# Patient Record
Sex: Female | Born: 1990 | Hispanic: No | Marital: Single | Smoking: Never smoker
Health system: Southern US, Community
[De-identification: ages and names within clinical notes are randomized; demographics above are authoritative.]

## PROBLEM LIST (undated history)

## (undated) DIAGNOSIS — F419 Anxiety disorder, unspecified: Secondary | ICD-10-CM

## (undated) DIAGNOSIS — H269 Unspecified cataract: Secondary | ICD-10-CM

## (undated) DIAGNOSIS — T7840XA Allergy, unspecified, initial encounter: Secondary | ICD-10-CM

## (undated) DIAGNOSIS — F32A Depression, unspecified: Secondary | ICD-10-CM

## (undated) DIAGNOSIS — F329 Major depressive disorder, single episode, unspecified: Secondary | ICD-10-CM

## (undated) DIAGNOSIS — M3300 Juvenile dermatopolymyositis, organ involvement unspecified: Secondary | ICD-10-CM

## (undated) HISTORY — DX: Juvenile dermatomyositis, organ involvement unspecified: M33.00

## (undated) HISTORY — DX: Depression, unspecified: F32.A

## (undated) HISTORY — DX: Unspecified cataract: H26.9

## (undated) HISTORY — DX: Allergy, unspecified, initial encounter: T78.40XA

## (undated) HISTORY — DX: Major depressive disorder, single episode, unspecified: F32.9

## (undated) HISTORY — DX: Anxiety disorder, unspecified: F41.9

## (undated) HISTORY — PX: OTHER SURGICAL HISTORY: SHX169

## (undated) HISTORY — PX: APPENDECTOMY: SHX54

---

## 2012-10-14 ENCOUNTER — Ambulatory Visit (INDEPENDENT_AMBULATORY_CARE_PROVIDER_SITE_OTHER): Payer: BC Managed Care – PPO | Admitting: Internal Medicine

## 2012-10-14 VITALS — BP 110/70 | HR 85 | Temp 98.6°F | Resp 16 | Ht 68.5 in | Wt 131.2 lb

## 2012-10-14 DIAGNOSIS — M3300 Juvenile dermatopolymyositis, organ involvement unspecified: Secondary | ICD-10-CM | POA: Insufficient documentation

## 2012-10-14 DIAGNOSIS — J309 Allergic rhinitis, unspecified: Secondary | ICD-10-CM | POA: Insufficient documentation

## 2012-10-14 DIAGNOSIS — R112 Nausea with vomiting, unspecified: Secondary | ICD-10-CM

## 2012-10-14 DIAGNOSIS — IMO0001 Reserved for inherently not codable concepts without codable children: Secondary | ICD-10-CM | POA: Insufficient documentation

## 2012-10-14 DIAGNOSIS — F319 Bipolar disorder, unspecified: Secondary | ICD-10-CM

## 2012-10-14 LAB — CBC WITH DIFFERENTIAL/PLATELET
Basophils Absolute: 0 10*3/uL (ref 0.0–0.1)
Basophils Relative: 0 % (ref 0–1)
Eosinophils Absolute: 0.1 10*3/uL (ref 0.0–0.7)
Eosinophils Relative: 2 % (ref 0–5)
HCT: 32.4 % — ABNORMAL LOW (ref 36.0–46.0)
Hemoglobin: 11.1 g/dL — ABNORMAL LOW (ref 12.0–15.0)
Lymphocytes Relative: 23 % (ref 12–46)
Lymphs Abs: 1.1 10*3/uL (ref 0.7–4.0)
MCH: 28.9 pg (ref 26.0–34.0)
MCHC: 34.3 g/dL (ref 30.0–36.0)
MCV: 84.4 fL (ref 78.0–100.0)
Monocytes Absolute: 0.5 10*3/uL (ref 0.1–1.0)
Monocytes Relative: 10 % (ref 3–12)
Neutro Abs: 2.9 10*3/uL (ref 1.7–7.7)
Neutrophils Relative %: 65 % (ref 43–77)
Platelets: 373 10*3/uL (ref 150–400)
RBC: 3.84 MIL/uL — ABNORMAL LOW (ref 3.87–5.11)
RDW: 13 % (ref 11.5–15.5)
WBC: 4.5 10*3/uL (ref 4.0–10.5)

## 2012-10-14 LAB — COMPREHENSIVE METABOLIC PANEL
ALT: 18 U/L (ref 0–35)
AST: 24 U/L (ref 0–37)
Albumin: 4.4 g/dL (ref 3.5–5.2)
Alkaline Phosphatase: 41 U/L (ref 39–117)
BUN: 10 mg/dL (ref 6–23)
CO2: 27 mEq/L (ref 19–32)
Calcium: 9.3 mg/dL (ref 8.4–10.5)
Chloride: 104 mEq/L (ref 96–112)
Creat: 0.46 mg/dL — ABNORMAL LOW (ref 0.50–1.10)
Glucose, Bld: 87 mg/dL (ref 70–99)
Potassium: 4.4 mEq/L (ref 3.5–5.3)
Sodium: 137 mEq/L (ref 135–145)
Total Bilirubin: 0.4 mg/dL (ref 0.3–1.2)
Total Protein: 7.5 g/dL (ref 6.0–8.3)

## 2012-10-14 LAB — POCT URINALYSIS DIPSTICK
Bilirubin, UA: NEGATIVE
Blood, UA: NEGATIVE
Glucose, UA: NEGATIVE
Nitrite, UA: NEGATIVE
Urobilinogen, UA: 0.2

## 2012-10-14 LAB — POCT UA - MICROSCOPIC ONLY
Casts, Ur, LPF, POC: NEGATIVE
Crystals, Ur, HPF, POC: NEGATIVE
Yeast, UA: NEGATIVE

## 2012-10-14 MED ORDER — ONDANSETRON HCL 4 MG PO TABS: 4.0000 mg | ORAL_TABLET | Freq: Three times a day (TID) | ORAL | Status: DC | PRN

## 2012-10-14 NOTE — Progress Notes (Signed)
Subjective:    Patient ID: Martha Noble, female    DOB: 02-20-1990, 22 y.o.   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 [place] I believe Concerned about 10 days of nausea/has only vomited twice during that time on the first day and this morning No abdominal pain No constipation or diarrhea No history of ulcer problems or reflux/no current symptoms to suggest those Appetite has been decreased during this time with a 4 pound weight loss She has not had fever No dysuria or frequency No vaginal discharge Last menstrual period 3 weeks ago Sexually active with contraception/2 negative pregnancy tests yesterday Surgery 3 weeks ago to remove a calcified lesion in her back which was normal Vertigo on 2 days preceding the onset of nausea which resolved spontaneously  Past medical history -Juvenile dermatomyositis onset age 53- -bipolar disorder -Allergic rhinitis Appendicitis  At 11  Current outpatient prescriptions:busPIRone (BUSPAR) 10 MG tablet, Take 10 mg by mouth 2 (two) times daily., Disp: , Rfl: ;   cycloSPORINE (SANDIMMUNE) 100 MG capsule, Take 150 mg by mouth 2 (two) times daily., Disp: , Rfl: ;   doxylamine, Sleep, (UNISOM) 25 MG tablet, Take 25 mg by mouth at bedtime as needed for sleep., Disp: , Rfl: ;   escitalopram (LEXAPRO) 10 MG tablet, Take 10 mg by mouth daily., Disp: , Rfl:  hydroxychloroquine (PLAQUENIL) 200 MG tablet, Take 300 mg by mouth daily., Disp: , Rfl: ;   lamoTRIgine (LAMICTAL) 100 MG tablet, Take 100 mg by mouth 2 (two) times daily., Disp: , Rfl: ;   norethindrone-ethinyl estradiol (JUNEL FE,GILDESS FE,LOESTRIN FE) 1-20 MG-MCG tablet, Take 1 tablet by mouth daily., Disp: , Rfl: ;   predniSONE (DELTASONE) 1 MG tablet, Take 2 mg by mouth daily., Disp: , Rfl: (weaning off)    Review of Systems  Constitutional: Positive for appetite change, fatigue and unexpected weight change. Negative for fever, chills and activity change.   HENT: Positive for sneezing and postnasal drip. Negative for ear pain, mouth sores, trouble swallowing and neck pain.   Eyes: Negative for visual disturbance.  Respiratory: Negative for cough, chest tightness and shortness of breath.   Cardiovascular: Negative for chest pain, palpitations and leg swelling.  Gastrointestinal: Negative for abdominal pain, diarrhea, constipation, blood in stool, abdominal distention and anal bleeding.  Genitourinary: Negative for urgency, frequency, difficulty urinating, menstrual problem and pelvic pain.  Musculoskeletal: Negative for back pain and joint swelling.  Neurological: Negative for weakness and headaches.  Hematological: Does not bruise/bleed easily.       Objective:   Physical Exam BP 110/70  Pulse 85  Temp(Src) 98.6 F (37 C) (Oral)  Resp 16  Ht 5' 8.5" (1.74 m)  Wt 131 lb 3.2 oz (59.512 kg)  BMI 19.66 kg/m2  SpO2 100%  LMP 10/06/2012 No acute distress Well-developed and well-nourished Pupils equal round reactive to light and accommodation/EOMs conjugate TMs clear/nares slightly boggy Throat clear No a.c. Notes/1+ right p.c. note she's been present for years No thyromegaly Lungs clear Heart regular without murmur  Abdomen benign without organomegaly Skin shows some pigmentary changes and thickening but nothing acute Joints clear Neuro intact Mood good     Assessment & Plan:  Nausea with vomiting - Plan: CBC with Differential, Comprehensive metabolic panel, POCT urinalysis dipstick, POCT UA - Microscopic Only, ondansetron (ZOFRAN) 4 MG tablet  Unclear etiology-we discussed several possibilities and decided that the best approach was to control the nausea and followup for further symptoms before doing extensive testing She will use  Zofran and push fluids and followup if not well a week Results for orders placed in visit on 10/14/12  CBC WITH DIFFERENTIAL      Result Value Range   WBC 4.5  4.0 - 10.5 K/uL   RBC 3.84 (*) 3.87  - 5.11 MIL/uL   Hemoglobin 11.1 (*) 12.0 - 15.0 g/dL   HCT 81.1 (*) 91.4 - 78.2 %   MCV 84.4  78.0 - 100.0 fL   MCH 28.9  26.0 - 34.0 pg   MCHC 34.3  30.0 - 36.0 g/dL   RDW 95.6  21.3 - 08.6 %   Platelets 373  150 - 400 K/uL   Neutrophils Relative % 65  43 - 77 %   Neutro Abs 2.9  1.7 - 7.7 K/uL   Lymphocytes Relative 23  12 - 46 %   Lymphs Abs 1.1  0.7 - 4.0 K/uL   Monocytes Relative 10  3 - 12 %   Monocytes Absolute 0.5  0.1 - 1.0 K/uL   Eosinophils Relative 2  0 - 5 %   Eosinophils Absolute 0.1  0.0 - 0.7 K/uL   Basophils Relative 0  0 - 1 %   Basophils Absolute 0.0  0.0 - 0.1 K/uL   Smear Review Criteria for review not met    COMPREHENSIVE METABOLIC PANEL      Result Value Range   Sodium 137  135 - 145 mEq/L   Potassium 4.4  3.5 - 5.3 mEq/L   Chloride 104  96 - 112 mEq/L   CO2 27  19 - 32 mEq/L   Glucose, Bld 87  70 - 99 mg/dL   BUN 10  6 - 23 mg/dL   Creat 5.78 (*) 4.69 - 1.10 mg/dL   Total Bilirubin 0.4  0.3 - 1.2 mg/dL   Alkaline Phosphatase 41  39 - 117 U/L   AST 24  0 - 37 U/L   ALT 18  0 - 35 U/L   Total Protein 7.5  6.0 - 8.3 g/dL   Albumin 4.4  3.5 - 5.2 g/dL   Calcium 9.3  8.4 - 62.9 mg/dL  POCT URINALYSIS DIPSTICK      Result Value Range   Color, UA yellow     Clarity, UA clear     Glucose, UA neg     Bilirubin, UA neg     Ketones, UA neg     Spec Grav, UA >=1.030     Blood, UA neg     pH, UA 7.0     Protein, UA neg     Urobilinogen, UA 0.2     Nitrite, UA neg     Leukocytes, UA Trace    POCT UA - MICROSCOPIC ONLY      Result Value Range   WBC, Ur, HPF, POC 0-2     RBC, urine, microscopic 3-4     Bacteria, U Microscopic 1+     Mucus, UA pos     Epithelial cells, urine per micros 3-6     Crystals, Ur, HPF, POC neg     Casts, Ur, LPF, POC neg     Yeast, UA neg

## 2012-10-14 NOTE — Progress Notes (Signed)
  Subjective:    Patient ID: Martha Noble, female    DOB: 09-23-1990, 22 y.o.   MRN: [id number]  HPI    Review of Systems     Objective:   Physical Exam        Assessment & Plan:

## 2014-09-06 ENCOUNTER — Ambulatory Visit (INDEPENDENT_AMBULATORY_CARE_PROVIDER_SITE_OTHER): Payer: BLUE CROSS/BLUE SHIELD | Admitting: Physician Assistant

## 2014-09-06 ENCOUNTER — Ambulatory Visit (INDEPENDENT_AMBULATORY_CARE_PROVIDER_SITE_OTHER): Payer: BLUE CROSS/BLUE SHIELD

## 2014-09-06 VITALS — BP 110/72 | HR 101 | Temp 98.3°F | Resp 18 | Ht 68.5 in | Wt 132.0 lb

## 2014-09-06 DIAGNOSIS — D849 Immunodeficiency, unspecified: Secondary | ICD-10-CM

## 2014-09-06 DIAGNOSIS — R059 Cough, unspecified: Secondary | ICD-10-CM

## 2014-09-06 DIAGNOSIS — D899 Disorder involving the immune mechanism, unspecified: Secondary | ICD-10-CM

## 2014-09-06 DIAGNOSIS — R05 Cough: Secondary | ICD-10-CM

## 2014-09-06 MED ORDER — AZITHROMYCIN 250 MG PO TABS: ORAL_TABLET | ORAL | Status: DC

## 2014-09-06 MED ORDER — BENZONATATE 100 MG PO CAPS: 100.0000 mg | ORAL_CAPSULE | Freq: Three times a day (TID) | ORAL | Status: DC | PRN

## 2014-09-06 MED ORDER — HYDROCODONE-HOMATROPINE 5-1.5 MG/5ML PO SYRP: 5.0000 mL | ORAL_SOLUTION | Freq: Three times a day (TID) | ORAL | Status: DC | PRN

## 2014-09-06 NOTE — Progress Notes (Signed)
09/06/2014 at 3:39 PM  Martha Noble / DOB: Jun 20, 1990 / MRN: [id number]  The patient has Juvenile dermatomyositis; Bipolar disorder, unspecified; Allergic rhinitis; and Contraception on her problem list.  SUBJECTIVE  Chief complaint: Cough; Nasal Congestion; and Sore Throat  Martha Noble is a 24 y.o. female with a pertinent medical history of immunosuppression 2/2 medical therapy for dermatomyositis.  She complains of one week of cough, sore throat and nasal congestion.  The sore throat has resolved and the nasal congestion has improved.  She continues with dry cough, denies SOB, wheezing, nausea, diaphoresis, fever, and chest pain. Feels well aside from cough. She is a never smoker.   She  has a past medical history of Allergy; Depression; Cataract; Juvenile dermatomyositis; Anxiety; and Juvenile dermatomyositis.    Medications reviewed and updated by myself where necessary, and exist elsewhere in the encounter.   Ms. Yebra is allergic to amoxicillin. She  reports that she has never smoked. She does not have any smokeless tobacco history on file. She reports that she drinks about 0.6 oz of alcohol per week. She reports that she uses illicit drugs (Marijuana). She  has no sexual activity history on file. The patient  has past surgical history that includes Appendectomy; nodule removal rt flank; and removal of calcified .  Her family history includes Cancer in her maternal grandmother; Diabetes in her father and mother; Mental illness in her maternal grandfather and paternal grandmother.  Review of Systems  Eyes: Negative.   Skin: Negative for rash.  Neurological: Negative for dizziness, weakness and headaches.    OBJECTIVE  Her  height is 5' 8.5" (1.74 m) and weight is 132 lb (59.875 kg). Her oral temperature is 98.3 F (36.8 C). Her blood pressure is 110/72 and her pulse is 101. Her respiration is 18 and oxygen saturation is 96%.  The patient's body mass index is 19.78  kg/(m^2).  Physical Exam  Vitals reviewed. Constitutional: She is oriented to person, place, and time. She appears well-developed and well-nourished. No distress.  HENT:  Right Ear: Hearing, tympanic membrane, external ear and ear canal normal.  Left Ear: Hearing, tympanic membrane, external ear and ear canal normal.  Nose: Mucosal edema present. Right sinus exhibits no maxillary sinus tenderness and no frontal sinus tenderness. Left sinus exhibits no maxillary sinus tenderness and no frontal sinus tenderness.  Mouth/Throat: Uvula is midline, oropharynx is clear and moist and mucous membranes are normal.  Cardiovascular: Normal rate, regular rhythm and normal heart sounds.   Respiratory: Effort normal and breath sounds normal. She has no wheezes. She has no rales.  Neurological: She is alert and oriented to person, place, and time.  Skin: Skin is warm and dry. She is not diaphoretic.  Psychiatric: She has a normal mood and affect.    No results found for this or any previous visit (from the past 24 hour(s)).  [facility] reading (PRIMARY) by  Dr. Clelia Croft: Normal chest, osteopenic vertebre.   ASSESSMENT & PLAN  Idaly was seen today for cough, nasal congestion and sore throat.  Diagnoses and all orders for this visit:  Cough: Patient with reassuring exam and radiograph.  Given problem two will cover for bacterial etiology, however her symptoms are most consistent with a viral etiology.  She denies pain today. n Orders: -     DG Chest 2 View; Future -     azithromycin (ZITHROMAX) 250 MG tablet; Take 2 tabs PO x 1 dose, then 1 tab PO QD x 4  days -     HYDROcodone-homatropine (HYCODAN) 5-1.5 MG/5ML syrup; Take 5 mLs by mouth every 8 (eight) hours as needed for cough. -     benzonatate (TESSALON) 100 MG capsule; Take 1-2 capsules (100-200 mg total) by mouth 3 (three) times daily as needed for cough.  Immunosuppression  The patient was advised to call or come back to clinic if she does not see an  improvement in symptoms, or worsens with the above plan.   Deliah Boston, MHS, PA-C [facility] 09/06/2014 3:39 PM   Addendum 5:30 PM Patient returned with a story that her primary care physician had wanted her to be evaluated by an M.D. Not PA and she was requesting that History is certainly consistent with bronchospasm secondary to a lower respiratory infection and my exam reveals nothing in HEENT or lung/heart that would disagree with the diagnosis-- this could either be viral or mycoplasma and so the treatment with Zithromax was prudent. Other medicines are for her comfort. If she worsens with the wheezing she can return to consider albuterol or prednisone but right now she has minimal wheezing with forced expiration and is not very symptomatic--I concur that the x-ray showed no acute infection I have participated in the care of this patient with the Advanced Practice Provider and agree with Diagnosis and Plan as documented. Robert P. Merla Riches, M.D.

## 2014-09-09 ENCOUNTER — Ambulatory Visit (INDEPENDENT_AMBULATORY_CARE_PROVIDER_SITE_OTHER): Payer: BLUE CROSS/BLUE SHIELD

## 2014-09-09 ENCOUNTER — Ambulatory Visit (INDEPENDENT_AMBULATORY_CARE_PROVIDER_SITE_OTHER): Payer: BLUE CROSS/BLUE SHIELD | Admitting: Emergency Medicine

## 2014-09-09 VITALS — BP 130/80 | HR 96 | Temp 98.5°F | Resp 18 | Ht 68.0 in | Wt 131.4 lb

## 2014-09-09 DIAGNOSIS — R109 Unspecified abdominal pain: Secondary | ICD-10-CM | POA: Diagnosis not present

## 2014-09-09 DIAGNOSIS — K5900 Constipation, unspecified: Secondary | ICD-10-CM

## 2014-09-09 NOTE — Patient Instructions (Signed)

## 2014-09-09 NOTE — Progress Notes (Signed)
Subjective:  Patient ID: Martha Noble, female    DOB: 1990-12-25  Age: 23 y.o. MRN: [id number]  CC: Constipation; Abdominal Pain; and Nausea   HPI Martha Noble presents   With abdominal pain  And constipation. She was seen over the weekend with bronchitis. She was treated with Zithromax and had a chest x-ray which was negative. Since that time she's had  Constipation and bloating. She has used a number of different over-the-counter remedies including prunes and Senokot. She's had some loose stool as as a consequence. She's had no blood in her stools or black stools. Said her symptoms of is improved but she still having some concerns about her abdomen she has no dysuria urgency or frequency  History Martha Noble has a past medical history of Allergy; Depression; Cataract; Juvenile dermatomyositis; Anxiety; and Juvenile dermatomyositis.   She has past surgical history that includes Appendectomy; nodule removal rt flank; and removal of calcified .   Her  family history includes Cancer in her maternal grandmother; Diabetes in her father and mother; Mental illness in her maternal grandfather and paternal grandmother.  She   reports that she has never smoked. She does not have any smokeless tobacco history on file. She reports that she drinks about 0.6 oz of alcohol per week. She reports that she uses illicit drugs (Marijuana).  Outpatient Prescriptions Prior to Visit  Medication Sig Dispense Refill  . azithromycin (ZITHROMAX) 250 MG tablet Take 2 tabs PO x 1 dose, then 1 tab PO QD x 4 days 6 tablet 0  . busPIRone (BUSPAR) 10 MG tablet Take 10 mg by mouth 2 (two) times daily.    . cycloSPORINE (SANDIMMUNE) 100 MG capsule Take 150 mg by mouth 2 (two) times daily.    Marland Kitchen escitalopram (LEXAPRO) 10 MG tablet Take 10 mg by mouth daily.    . hydroxychloroquine (PLAQUENIL) 200 MG tablet Take 300 mg by mouth daily.    Marland Kitchen lamoTRIgine (LAMICTAL) 100 MG tablet Take 100 mg by mouth 2 (two) times daily.     Marland Kitchen levonorgestrel (MIRENA) 20 MCG/24HR IUD 1 each by Intrauterine route once.    . mycophenolate (CELLCEPT) 250 MG capsule Take 1,000 mg by mouth 2 (two) times daily.    . predniSONE (DELTASONE) 1 MG tablet Take 2 mg by mouth daily.    . benzonatate (TESSALON) 100 MG capsule Take 1-2 capsules (100-200 mg total) by mouth 3 (three) times daily as needed for cough. (Patient not taking: Reported on 09/09/2014) 40 capsule 0  . doxylamine, Sleep, (UNISOM) 25 MG tablet Take 25 mg by mouth at bedtime as needed for sleep.    Marland Kitchen HYDROcodone-homatropine (HYCODAN) 5-1.5 MG/5ML syrup Take 5 mLs by mouth every 8 (eight) hours as needed for cough. (Patient not taking: Reported on 09/09/2014) 120 mL 0  . norethindrone-ethinyl estradiol (JUNEL FE,GILDESS FE,LOESTRIN FE) 1-20 MG-MCG tablet Take 1 tablet by mouth daily.    . ondansetron (ZOFRAN) 4 MG tablet Take 1 tablet (4 mg total) by mouth every 8 (eight) hours as needed for nausea. (Patient not taking: Reported on 09/06/2014) 20 tablet 0   No facility-administered medications prior to visit.    History   Social History  . Marital Status: Single    Spouse Name: N/A  . Number of Children: N/A  . Years of Education: N/A   Social History Main Topics  . Smoking status: Never Smoker   . Smokeless tobacco: Not on file  . Alcohol Use: 0.6 oz/week  1 Standard drinks or equivalent per week  . Drug Use: Yes    Special: Marijuana  . Sexual Activity: Not on file   Other Topics Concern  . None   Social History Narrative     Review of Systems  Constitutional: Negative for fever, chills and appetite change.  HENT: Negative for congestion, ear pain, postnasal drip, sinus pressure and sore throat.   Eyes: Negative for pain and redness.  Respiratory: Negative for cough, shortness of breath and wheezing.   Cardiovascular: Negative for leg swelling.  Gastrointestinal: Positive for constipation and abdominal distention. Negative for nausea, vomiting, abdominal  pain, diarrhea and blood in stool.  Endocrine: Negative for polyuria.  Genitourinary: Negative for dysuria, urgency, frequency and flank pain.  Musculoskeletal: Negative for gait problem.  Skin: Negative for rash.  Neurological: Negative for weakness and headaches.  Psychiatric/Behavioral: Negative for confusion and decreased concentration. The patient is not nervous/anxious.     Objective:  BP 130/80 mmHg  Pulse 96  Temp(Src) 98.5 F (36.9 C) (Oral)  Resp 18  Ht 5\' 8"  (1.727 m)  Wt 131 lb 6.4 oz (59.603 kg)  BMI 19.98 kg/m2  SpO2 97%  Physical Exam  Constitutional: She is oriented to person, place, and time. She appears well-developed and well-nourished. No distress.  HENT:  Head: Normocephalic and atraumatic.  Right Ear: External ear normal.  Left Ear: External ear normal.  Nose: Nose normal.  Eyes: Conjunctivae and EOM are normal. Pupils are equal, round, and reactive to light. No scleral icterus.  Neck: Normal range of motion. Neck supple. No tracheal deviation present.  Cardiovascular: Normal rate, regular rhythm and normal heart sounds.   Pulmonary/Chest: Effort normal. No respiratory distress. She has no wheezes. She has no rales.  Abdominal: Soft. Normal appearance and bowel sounds are normal. She exhibits no distension and no mass. There is no hepatosplenomegaly. There is no tenderness. There is no rebound and no guarding.  Musculoskeletal: She exhibits no edema.  Lymphadenopathy:    She has no cervical adenopathy.  Neurological: She is alert and oriented to person, place, and time. Coordination normal.  Skin: Skin is warm and dry. No rash noted.  Psychiatric: She has a normal mood and affect. Her behavior is normal. Thought content normal.      Assessment & Plan:   Martha Noble was seen today for constipation, abdominal pain and nausea.  Diagnoses and all orders for this visit:  Abdominal pain, unspecified abdominal location Orders: -     DG Abd 1 View;  Future  Constipation, unspecified constipation type   I am having Ms. Kirkpatrick maintain her escitalopram, lamoTRIgine, busPIRone, predniSONE, cycloSPORINE, hydroxychloroquine, norethindrone-ethinyl estradiol, doxylamine (Sleep), ondansetron, levonorgestrel, mycophenolate, azithromycin, HYDROcodone-homatropine, and benzonatate.  No orders of the defined types were placed in this encounter.    I reviewed her x-ray with her and it was unremarkable with exception was a collection of stool in her right colon. I suggested that if she wanted something she should try a dose of mag citrate and that will clean her out and allow her to resume her normal normal bowel habit.  Appropriate red flag conditions were discussed with the patient as well as actions that should be taken.  Patient expressed his understanding.  Follow-up: No Follow-up on file.  Carmelina Dane, MD   [facility] reading (PRIMARY) by  Dr. Dareen Piano.  No obst or free air.

## 2014-10-11 ENCOUNTER — Ambulatory Visit (INDEPENDENT_AMBULATORY_CARE_PROVIDER_SITE_OTHER): Payer: BLUE CROSS/BLUE SHIELD | Admitting: Family Medicine

## 2014-10-11 VITALS — BP 124/90 | HR 88 | Temp 98.9°F | Resp 16 | Ht 68.0 in | Wt 131.0 lb

## 2014-10-11 DIAGNOSIS — R1030 Lower abdominal pain, unspecified: Secondary | ICD-10-CM

## 2014-10-11 DIAGNOSIS — R350 Frequency of micturition: Secondary | ICD-10-CM | POA: Diagnosis not present

## 2014-10-11 DIAGNOSIS — R102 Pelvic and perineal pain: Secondary | ICD-10-CM | POA: Diagnosis not present

## 2014-10-11 LAB — POCT CBC
Granulocyte percent: 79.3 %G (ref 37–80)
HCT, POC: 35.9 % — AB (ref 37.7–47.9)
Hemoglobin: 11.4 g/dL — AB (ref 12.2–16.2)
Lymph, poc: 1.2 (ref 0.6–3.4)
MCH, POC: 25 pg — AB (ref 27–31.2)
MCHC: 31.6 g/dL — AB (ref 31.8–35.4)
MCV: 79.2 fL — AB (ref 80–97)
MID (cbc): 0.5 (ref 0–0.9)
MPV: 7.4 fL (ref 0–99.8)
POC Granulocyte: 6.5 (ref 2–6.9)
POC LYMPH PERCENT: 14.4 %L (ref 10–50)
POC MID %: 6.3 %M (ref 0–12)
Platelet Count, POC: 353 10*3/uL (ref 142–424)
RBC: 4.54 M/uL (ref 4.04–5.48)
RDW, POC: 15.2 %
WBC: 8.2 10*3/uL (ref 4.6–10.2)

## 2014-10-11 LAB — POCT URINALYSIS DIPSTICK
Bilirubin, UA: NEGATIVE
Blood, UA: NEGATIVE
Glucose, UA: NEGATIVE
Ketones, UA: NEGATIVE
Leukocytes, UA: NEGATIVE
Nitrite, UA: NEGATIVE
Protein, UA: NEGATIVE
Spec Grav, UA: <=1.005
Urobilinogen, UA: 0.2
pH, UA: 6

## 2014-10-11 LAB — POCT UA - MICROSCOPIC ONLY
Casts, Ur, LPF, POC: NEGATIVE
Crystals, Ur, HPF, POC: NEGATIVE
Epithelial cells, urine per micros: NEGATIVE
Mucus, UA: NEGATIVE
RBC, urine, microscopic: NEGATIVE
Yeast, UA: NEGATIVE

## 2014-10-11 MED ORDER — METRONIDAZOLE 500 MG PO TABS: 500.0000 mg | ORAL_TABLET | Freq: Two times a day (BID) | ORAL | Status: AC

## 2014-10-11 MED ORDER — DOXYCYCLINE HYCLATE 100 MG PO TABS: 100.0000 mg | ORAL_TABLET | Freq: Two times a day (BID) | ORAL | Status: AC

## 2014-10-11 NOTE — Patient Instructions (Signed)
Endometritis Endometritis is an irritation, soreness, and swelling (inflammation) of the lining of the uterus (endometrium).  CAUSES   Bacterial infections.  Sexually transmitted infections (STIs).  Having a miscarriage or childbirth, especially after a long labor or cesarean delivery.  Certain gynecological procedures (such as dilation and curettage, hysteroscopy, or contraceptive insertion). SIGNS AND SYMPTOMS   Fever.  Lower abdominal or pelvic pain.  Abnormal vaginal discharge or bleeding.  Abdominal bloating (distention) or swelling.  General discomfort or ill feeling.  Discomfort with bowel movements. DIAGNOSIS  A physical and pelvic exam are performed. Other tests may include:  Cultures from the cervix.  Blood tests.  Examining a tissue sample of the uterine lining (endometrial biopsy).  Examining discharge under a microscope (wet prep).  Laparoscopy. TREATMENT  Antibiotic medicines are usually given. Other treatments may include:  Fluids through an IV tube inserted in your vein.  Rest. HOME CARE INSTRUCTIONS   Take over-the-counter or prescription medicines for pain, discomfort, or fever as directed by your health care provider.  Take your antibiotics as directed. Finish them even if you start to feel better.  Resume your normal diet and activities as directed or as tolerated.  Do not douche or have sexual intercourse until your health care provider says it is okay.  Do not have sexual intercourse until your partner has been treated if your endometritis is caused by an STI. SEEK IMMEDIATE MEDICAL CARE IF:   You have swelling or increasing pain in the abdomen.  You have a fever.  You have bad smelling vaginal discharge, or you have an increased amount of discharge.  You have abnormal vaginal bleeding.  Your medicine is not helping with the pain.  You experience any problems that may be related to the medicine you are taking.  You have nausea  and vomiting, or you cannot keep foods down.  You have pain with bowel movements. MAKE SURE YOU:   Understand these instructions.  Will watch your condition.  Will get help right away if you are not doing well or get worse. Document Released: 01/23/2001 Document Revised: 10/01/2012 Document Reviewed: 08/28/2012 Exit[employer] Patient Information 2015 [employer]. This information is not intended to replace advice given to you by your health care provider. Make sure you discuss any questions you have with your health care provider.  

## 2014-10-11 NOTE — Progress Notes (Signed)
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  Also patient has developed some "bumps" on her labia.  Gynecologist tested for BV, HPV ruled out.  Non painful.  Had normal PAP at beginning of August.  PMHx:  S/P appendectomy, h/o dermatomyositis.  IUD in place  Sig Negs:  No fever  Objective:  NAD BP 124/90 mmHg  Pulse 88  Temp(Src) 98.9 F (37.2 C) (Oral)  Resp 16  Ht '5\' 8"'  (1.727 m)  Wt 131 lb (59.421 kg)  BMI 19.92 kg/m2  SpO2 98% Abdomen:  Soft, no guarding, no HSM.  Tender suprapubic area with deep palpation and some rebound.   Results for orders placed or performed in visit on 10/14/12  CBC with Differential  Result Value Ref Range   WBC 4.5 4.0 - 10.5 K/uL   RBC 3.84 (L) 3.87 - 5.11 MIL/uL   Hemoglobin 11.1 (L) 12.0 - 15.0 g/dL   HCT 32.4 (L) 36.0 - 46.0 %   MCV 84.4 78.0 - 100.0 fL   MCH 28.9 26.0 - 34.0 pg   MCHC 34.3 30.0 - 36.0 g/dL   RDW 13.0 11.5 - 15.5 %   Platelets 373 150 - 400 K/uL   Neutrophils Relative % 65 43 - 77 %   Neutro Abs 2.9 1.7 - 7.7 K/uL   Lymphocytes Relative 23 12 - 46 %   Lymphs Abs 1.1 0.7 - 4.0 K/uL   Monocytes Relative 10 3 - 12 %   Monocytes Absolute 0.5 0.1 - 1.0 K/uL   Eosinophils Relative 2 0 - 5 %   Eosinophils Absolute 0.1 0.0 - 0.7 K/uL   Basophils Relative 0 0 - 1 %   Basophils Absolute 0.0 0.0 - 0.1 K/uL   Smear Review Criteria for review not met   Comprehensive metabolic panel  Result Value Ref Range   Sodium 137 135 - 145 mEq/L   Potassium 4.4 3.5 - 5.3 mEq/L   Chloride 104 96 - 112 mEq/L   CO2 27 19 - 32 mEq/L   Glucose, Bld 87 70 - 99 mg/dL   BUN 10 6 - 23 mg/dL   Creat 0.46 (L) 0.50 - 1.10 mg/dL   Total Bilirubin 0.4 0.3 - 1.2 mg/dL   Alkaline Phosphatase 41 39 - 117 U/L   AST 24 0 - 37 U/L   ALT 18 0 - 35 U/L   Total Protein 7.5 6.0 - 8.3 g/dL   Albumin 4.4 3.5 - 5.2 g/dL   Calcium 9.3 8.4 - 10.5 mg/dL  POCT  urinalysis dipstick  Result Value Ref Range   Color, UA yellow    Clarity, UA clear    Glucose, UA neg    Bilirubin, UA neg    Ketones, UA neg    Spec Grav, UA >=1.030    Blood, UA neg    pH, UA 7.0    Protein, UA neg    Urobilinogen, UA 0.2    Nitrite, UA neg    Leukocytes, UA Trace   POCT UA - Microscopic Only  Result Value Ref Range   WBC, Ur, HPF, POC 0-2    RBC, urine, microscopic 3-4    Bacteria, U Microscopic 1+    Mucus, UA pos    Epithelial cells, urine per micros 3-6    Crystals, Ur, HPF, POC neg    Casts, Ur, LPF, POC neg    Yeast, UA neg  Results for orders placed or performed in visit on 10/11/14  POCT UA - Microscopic Only  Result Value Ref Range   WBC, Ur, HPF, POC 0-1    RBC, urine, microscopic neg    Bacteria, U Microscopic trace    Mucus, UA neg    Epithelial cells, urine per micros neg    Crystals, Ur, HPF, POC neg    Casts, Ur, LPF, POC neg    Yeast, UA neg   POCT urinalysis dipstick  Result Value Ref Range   Color, UA yellow    Clarity, UA clear    Glucose, UA neg    Bilirubin, UA neg    Ketones, UA neg    Spec Grav, UA <=1.005    Blood, UA neg    pH, UA 6.0    Protein, UA neg    Urobilinogen, UA 0.2    Nitrite, UA neg    Leukocytes, UA Negative Negative  POCT CBC  Result Value Ref Range   WBC 8.2 4.6 - 10.2 K/uL   Lymph, poc 1.2 0.6 - 3.4   POC LYMPH PERCENT 14.4 10 - 50 %L   MID (cbc) 0.5 0 - 0.9   POC MID % 6.3 0 - 12 %M   POC Granulocyte 6.5 2 - 6.9   Granulocyte percent 79.3 37 - 80 %G   RBC 4.54 4.04 - 5.48 M/uL   Hemoglobin 11.4 (A) 12.2 - 16.2 g/dL   HCT, POC 35.9 (A) 37.7 - 47.9 %   MCV 79.2 (A) 80 - 97 fL   MCH, POC 25.0 (A) 27 - 31.2 pg   MCHC 31.6 (A) 31.8 - 35.4 g/dL   RDW, POC 15.2 %   Platelet Count, POC 353 142 - 424 K/uL   MPV 7.4 0 - 99.8 fL     Pelvic exam: External genitalia show some mild irregular labial contour without HBP appearing lesions. Vagina shows mild white discharge Patient has cervical  motion tenderness Patient has mildly enlarged uterus which is tender, no adnexal swelling or tenderness  Assessment: Endometritis, mild  This chart was scribed in my presence and reviewed by me personally.    ICD-9-CM ICD-10-CM   1. Urinary frequency 788.41 R35.0 POCT UA - Microscopic Only     POCT urinalysis dipstick     Urine culture     GC/Chlamydia Probe Amp     HIV antibody (with reflex)     doxycycline (VIBRA-TABS) 100 MG tablet     metroNIDAZOLE (FLAGYL) 500 MG tablet  2. Lower abdominal pain 789.09 R10.30 POCT UA - Microscopic Only     POCT urinalysis dipstick     Urine culture     POCT CBC     GC/Chlamydia Probe Amp     HIV antibody (with reflex)     doxycycline (VIBRA-TABS) 100 MG tablet     metroNIDAZOLE (FLAGYL) 500 MG tablet  3. Female pelvic pain 625.9 R10.2 GC/Chlamydia Probe Amp     HIV antibody (with reflex)     doxycycline (VIBRA-TABS) 100 MG tablet     metroNIDAZOLE (FLAGYL) 500 MG tablet     Signed, Robyn Haber, MD

## 2014-10-12 LAB — URINE CULTURE: Colony Count: 6000

## 2014-10-12 LAB — HIV ANTIBODY (ROUTINE TESTING W REFLEX): HIV 1&2 Ab, 4th Generation: NONREACTIVE

## 2014-10-14 ENCOUNTER — Telehealth: Payer: Self-pay

## 2014-10-14 NOTE — Telephone Encounter (Signed)
Yes, I would like this done       Previous Messages     ----- Message -----   From: Johnnette Litter, CMA   Sent: 10/12/2014  2:45 PM    To: Elvina Sidle, MD   Wrong swab was used for [facility] collection. Would you like pt to RTC for a Uriprobe? Recollect at no charge      Left message on machine to call back

## 2014-10-15 LAB — GC/CHLAMYDIA PROBE AMP

## 2014-10-15 NOTE — Telephone Encounter (Signed)
Spoke with pt. Doesn't feel like she's been exposed to any STD's and declines coming in for a retest at this time

## 2014-10-19 ENCOUNTER — Telehealth: Payer: Self-pay

## 2014-10-19 NOTE — Telephone Encounter (Signed)
Patient is returning a missed phone call from lab. Please call! °

## 2014-10-20 NOTE — Telephone Encounter (Signed)
Will call pt. Notified of all labs already.

## 2014-11-03 ENCOUNTER — Other Ambulatory Visit (HOSPITAL_COMMUNITY): Payer: Self-pay | Admitting: *Deleted

## 2014-11-04 ENCOUNTER — Ambulatory Visit (HOSPITAL_COMMUNITY)
Admission: RE | Admit: 2014-11-04 | Discharge: 2014-11-04 | Disposition: A | Discharge status: Home / Self Care | Payer: BLUE CROSS/BLUE SHIELD | Source: Ambulatory Visit | Attending: Pediatrics | Admitting: Pediatrics

## 2014-11-04 DIAGNOSIS — M3300 Juvenile dermatopolymyositis, organ involvement unspecified: Secondary | ICD-10-CM | POA: Insufficient documentation

## 2014-11-04 MED ORDER — ONDANSETRON HCL 4 MG/2ML IJ SOLN
INTRAMUSCULAR | Status: AC
  Administered 2014-11-04: 4 mg
  Filled 2014-11-04: qty 2

## 2014-11-04 MED ORDER — ACETAMINOPHEN 325 MG PO TABS
ORAL_TABLET | ORAL | Status: AC
Start: 2014-11-04 — End: 2014-11-04
  Filled 2014-11-04: qty 2

## 2014-11-04 MED ORDER — DIPHENHYDRAMINE HCL 25 MG PO TABS
50.0000 mg | ORAL_TABLET | ORAL | Status: DC
  Administered 2014-11-04: 50 mg via ORAL
  Filled 2014-11-04: qty 2

## 2014-11-04 MED ORDER — SODIUM CHLORIDE 0.9 % IV SOLN
250.0000 mg | INTRAVENOUS | Status: DC
  Administered 2014-11-04: 250 mg via INTRAVENOUS
  Filled 2014-11-04: qty 2

## 2014-11-04 MED ORDER — SODIUM CHLORIDE 0.9 % IV SOLN
INTRAVENOUS | Status: DC
  Administered 2014-11-04: 10:00:00 via INTRAVENOUS

## 2014-11-04 MED ORDER — ACETAMINOPHEN 325 MG PO TABS
650.0000 mg | ORAL_TABLET | ORAL | Status: DC
  Administered 2014-11-04: 650 mg via ORAL

## 2014-11-04 MED ORDER — SODIUM CHLORIDE 0.9 % IV SOLN
1000.0000 mg | INTRAVENOUS | Status: DC
  Administered 2014-11-04: 1000 mg via INTRAVENOUS
  Filled 2014-11-04: qty 100

## 2014-11-04 MED ORDER — DIPHENHYDRAMINE HCL 25 MG PO CAPS
ORAL_CAPSULE | ORAL | Status: AC
  Filled 2014-11-04: qty 2

## 2014-11-04 MED ORDER — FAMOTIDINE 20 MG PO TABS
20.0000 mg | ORAL_TABLET | ORAL | Status: DC
  Administered 2014-11-04: 20 mg via ORAL
  Filled 2014-11-04: qty 1

## 2014-11-04 MED ORDER — ONDANSETRON HCL 4 MG/2ML IJ SOLN: 4.0000 mg | Freq: Once | INTRAMUSCULAR | Status: DC

## 2014-11-04 NOTE — Discharge Instructions (Signed)
Excuse from Work, [employer], or Physical Activity _____________________________[name]_____________________ needs to be excused from: _____ Work _x____ [employer] _____ Physical activity Beginning now and through the following date: _09/22/2016___________________ _____ she may return to work or school but still avoid physical activity from now until: ____________________ _x____ she may return to full physical activity as of: ______09/23/2016______________ Caregiver's signature: __Renee Peggye Pitt RN______________________________________  Date: ________________09/22/2016  [facility]______________________________________ Document Released: 07/25/2000 Document Revised: 04/23/2011 Document Reviewed: 01/29/2005 [employer] Patient Information 2015 [place], [place]. This information is not intended to replace advice given to you by your health care provider. Make sure you discuss any questions you have with your health care provider.

## 2014-11-18 ENCOUNTER — Encounter (HOSPITAL_COMMUNITY)
Admission: RE | Admit: 2014-11-18 | Discharge: 2014-11-18 | Disposition: A | Discharge status: Home / Self Care | Payer: BLUE CROSS/BLUE SHIELD | Source: Ambulatory Visit | Attending: Pediatrics | Admitting: Pediatrics

## 2014-11-18 DIAGNOSIS — M3300 Juvenile dermatopolymyositis, organ involvement unspecified: Secondary | ICD-10-CM | POA: Diagnosis present

## 2014-11-18 MED ORDER — FAMOTIDINE 20 MG PO TABS
20.0000 mg | ORAL_TABLET | ORAL | Status: AC
  Administered 2014-11-18: 20 mg via ORAL

## 2014-11-18 MED ORDER — DIPHENHYDRAMINE HCL 25 MG PO CAPS
ORAL_CAPSULE | ORAL | Status: AC
  Filled 2014-11-18: qty 2

## 2014-11-18 MED ORDER — ONDANSETRON 4 MG PO TBDP
4.0000 mg | ORAL_TABLET | Freq: Once | ORAL | Status: DC
  Filled 2014-11-18: qty 1

## 2014-11-18 MED ORDER — ACETAMINOPHEN 325 MG PO TABS
ORAL_TABLET | ORAL | Status: AC
  Filled 2014-11-18: qty 2

## 2014-11-18 MED ORDER — SODIUM CHLORIDE 0.9 % IV SOLN
250.0000 mg | INTRAVENOUS | Status: AC
  Administered 2014-11-18: 250 mg via INTRAVENOUS
  Filled 2014-11-18: qty 2

## 2014-11-18 MED ORDER — ONDANSETRON HCL 4 MG PO TABS: 4.0000 mg | ORAL_TABLET | Freq: Once | ORAL | Status: DC

## 2014-11-18 MED ORDER — ACETAMINOPHEN 325 MG PO TABS
650.0000 mg | ORAL_TABLET | ORAL | Status: AC
  Administered 2014-11-18: 650 mg via ORAL

## 2014-11-18 MED ORDER — DIPHENHYDRAMINE HCL 25 MG PO CAPS
50.0000 mg | ORAL_CAPSULE | ORAL | Status: AC
  Administered 2014-11-18: 50 mg via ORAL

## 2014-11-18 MED ORDER — SODIUM CHLORIDE 0.9 % IV SOLN
1000.0000 mg | INTRAVENOUS | Status: AC
  Administered 2014-11-18: 1000 mg via INTRAVENOUS
  Filled 2014-11-18: qty 100

## 2014-11-18 MED ORDER — SODIUM CHLORIDE 0.9 % IV SOLN
INTRAVENOUS | Status: AC
  Administered 2014-11-18: 10:00:00 via INTRAVENOUS

## 2014-12-24 ENCOUNTER — Other Ambulatory Visit: Payer: Self-pay | Admitting: Family Medicine

## 2014-12-24 DIAGNOSIS — R52 Pain, unspecified: Secondary | ICD-10-CM

## 2014-12-24 DIAGNOSIS — N83209 Unspecified ovarian cyst, unspecified side: Secondary | ICD-10-CM

## 2014-12-30 ENCOUNTER — Ambulatory Visit
Admission: RE | Admit: 2014-12-30 | Discharge: 2014-12-30 | Disposition: A | Discharge status: Home / Self Care | Payer: Self-pay | Source: Ambulatory Visit | Attending: Family Medicine | Admitting: Family Medicine

## 2014-12-30 DIAGNOSIS — R52 Pain, unspecified: Secondary | ICD-10-CM

## 2014-12-30 DIAGNOSIS — N83209 Unspecified ovarian cyst, unspecified side: Secondary | ICD-10-CM

## 2016-03-01 IMAGING — CR DG ABDOMEN 1V
1 series · 1 of 1 positions shown · non-contrast
Comparison: None.

CLINICAL DATA: Abdominal pain

EXAM:
ABDOMEN - 1 VIEW

[AP]
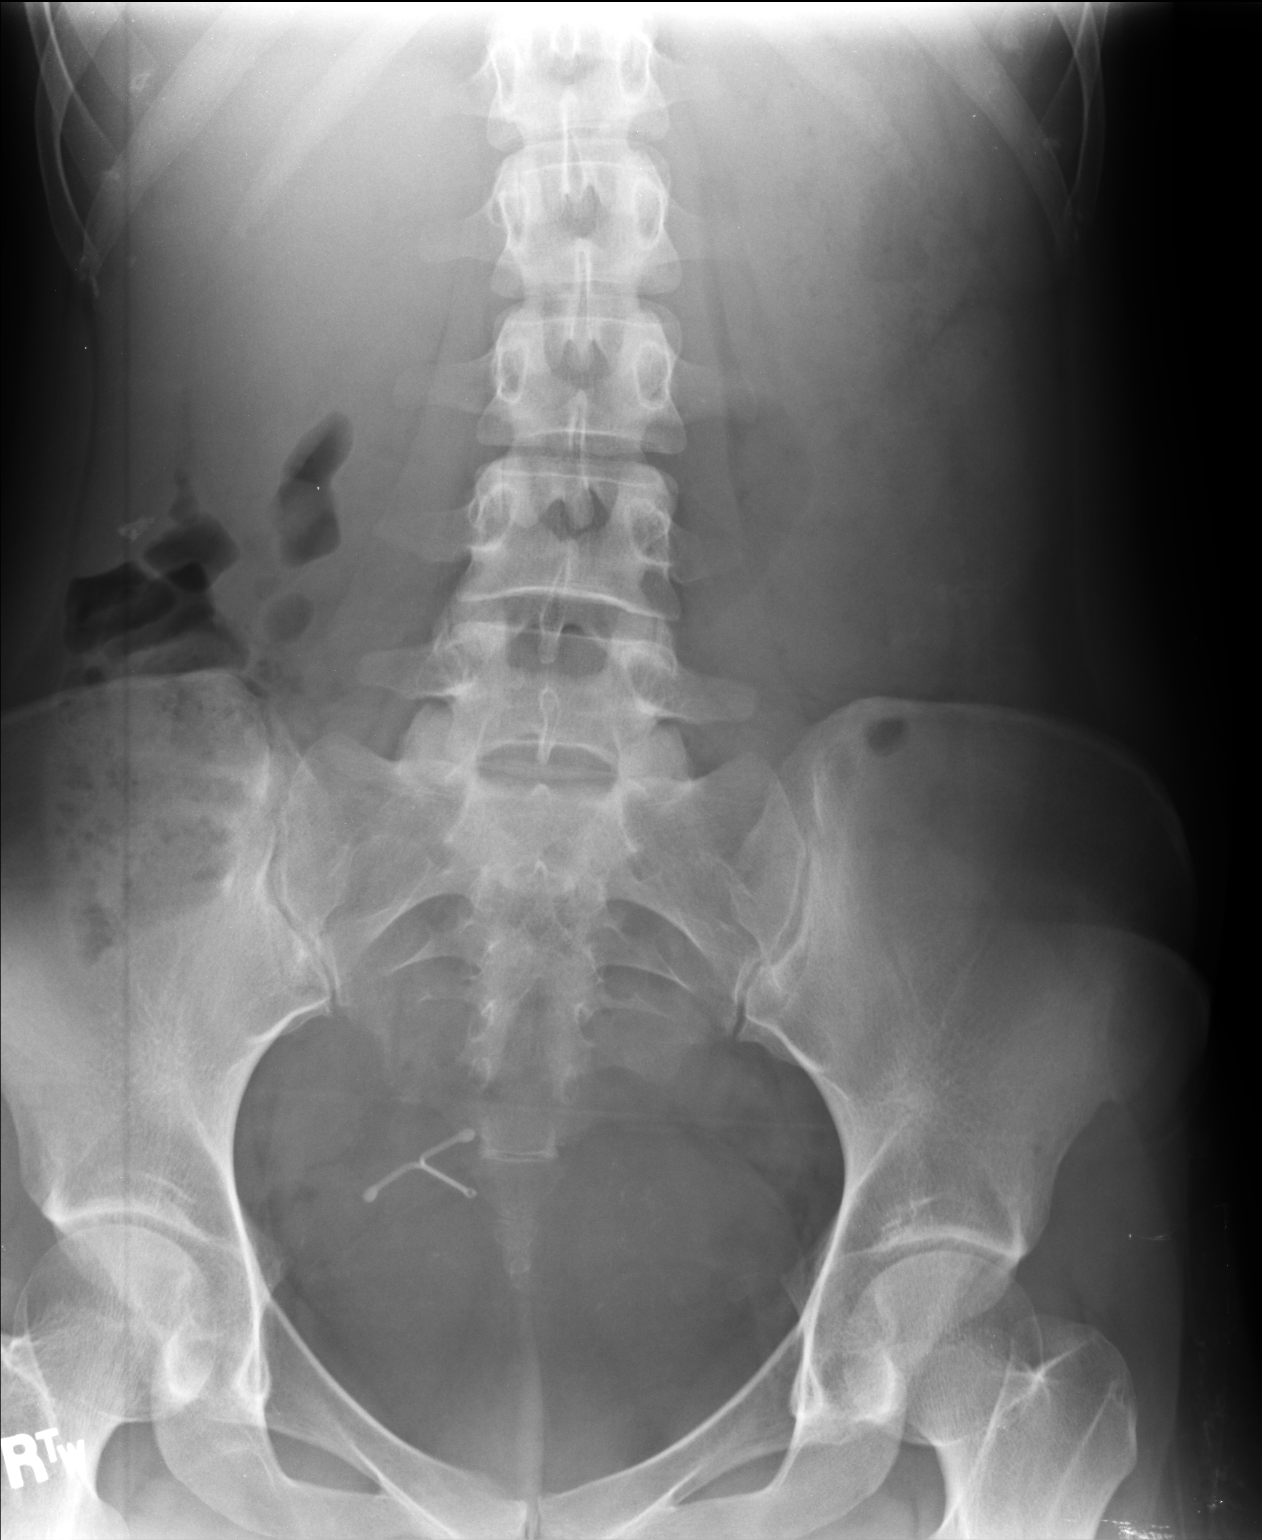

[1 of 1 positions shown; findings below may reference images not displayed]

FINDINGS: The bowel gas pattern is normal. No radio-opaque calculi or other
significant radiographic abnormality are seen.
IMPRESSION: Negative.

## 2016-06-21 IMAGING — US US TRANSVAGINAL NON-OB
1 series · 13 of 25 positions shown · non-contrast
Comparison: None available in PACs

CLINICAL DATA: Two weeks of pelvic pain, history of ovarian cysts
in the past.

EXAM:
TRANSABDOMINAL AND TRANSVAGINAL ULTRASOUND OF PELVIS
TECHNIQUE: Both transabdominal and transvaginal ultrasound examinations of the
pelvis were performed. Transabdominal technique was performed for
global imaging of the pelvis including uterus, ovaries, adnexal
regions, and pelvic cul-de-sac. It was necessary to proceed with
endovaginal exam following the transabdominal exam to visualize the
endometrium and ovaries.

[Series 1: us transvaginal non-ob · 0.18mm/px · 13 of 61 slices shown]
[im 1/61]
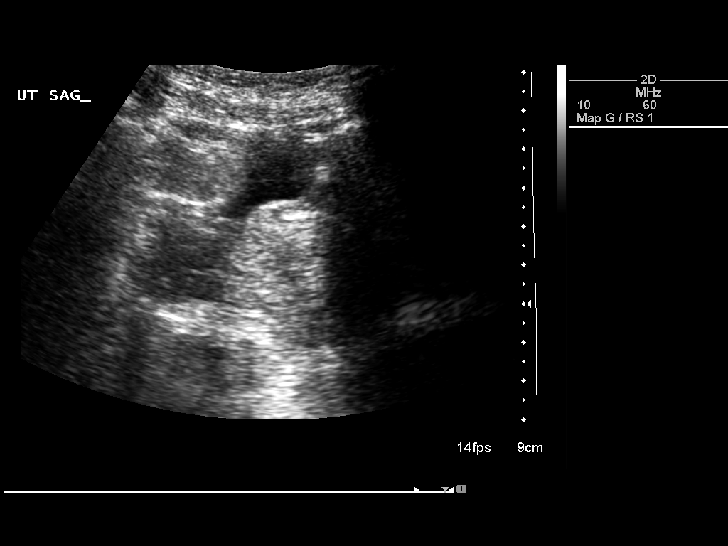
[im 6/61]
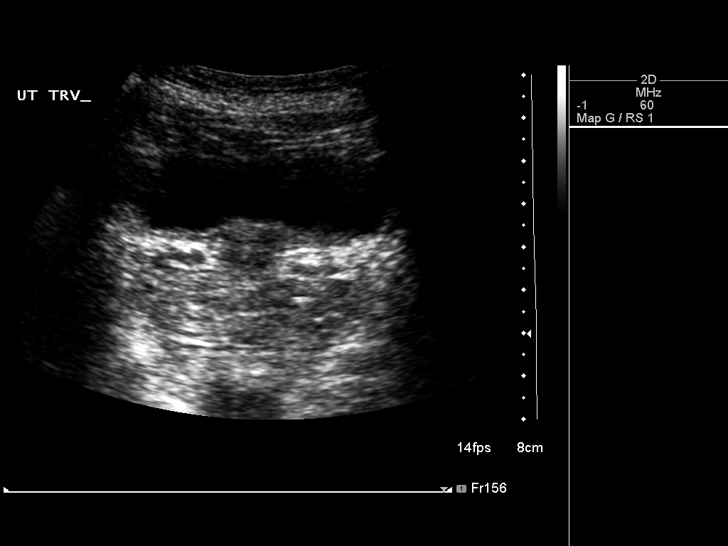
[im 11/61]
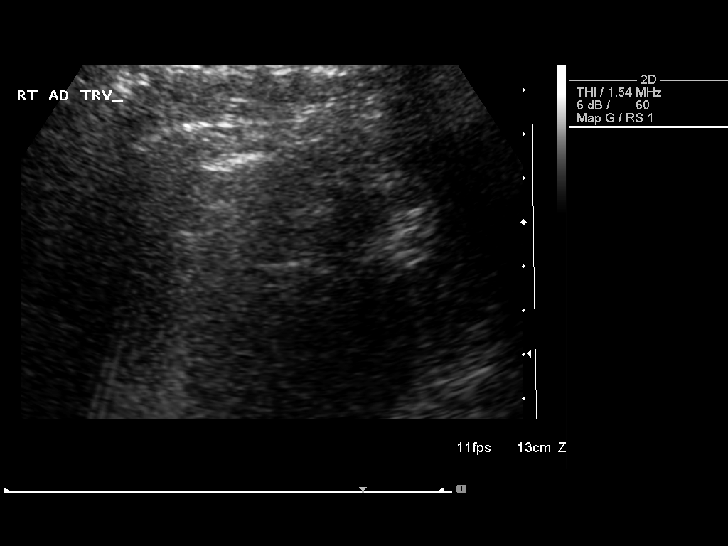
[im 16/61]
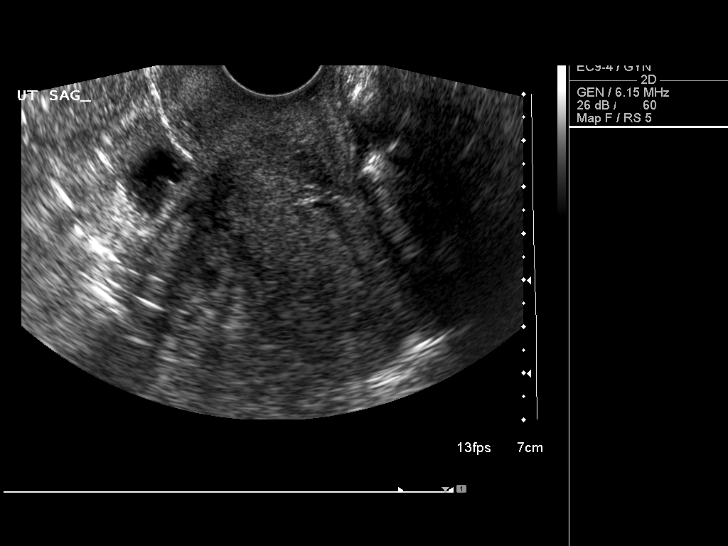
[im 21/61]
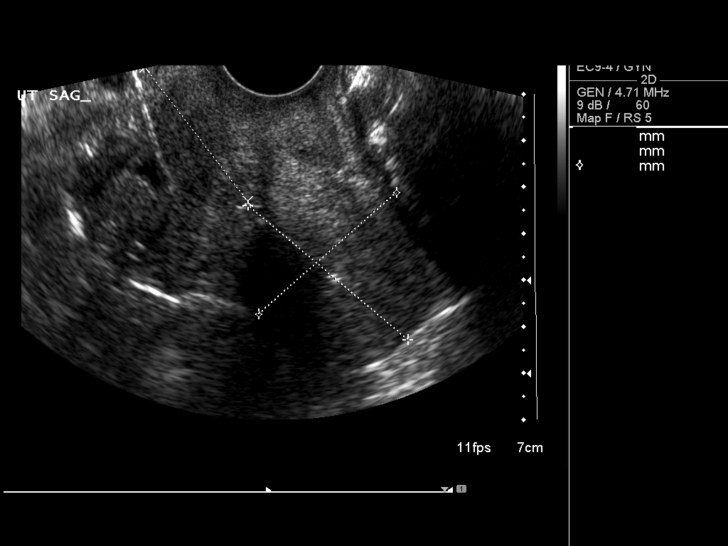
[im 26/61]
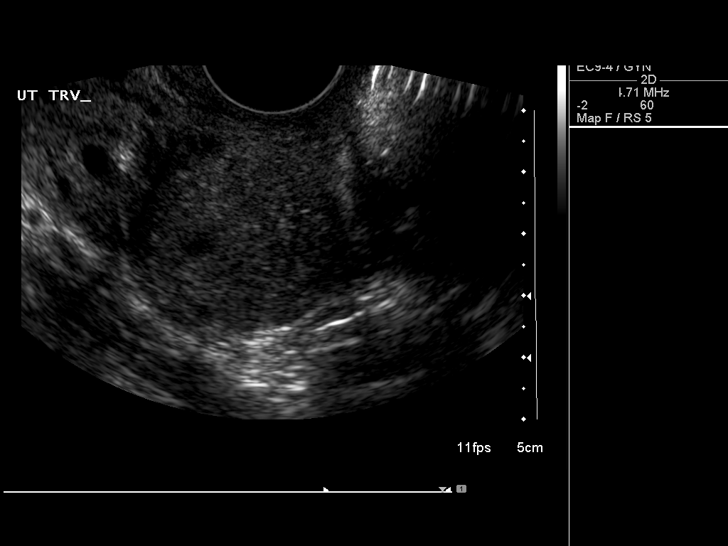
[im 31/61]
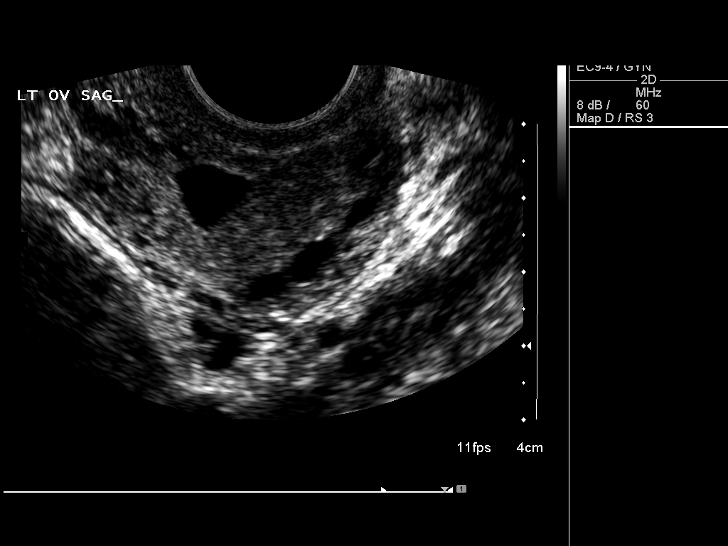
[im 36/61]
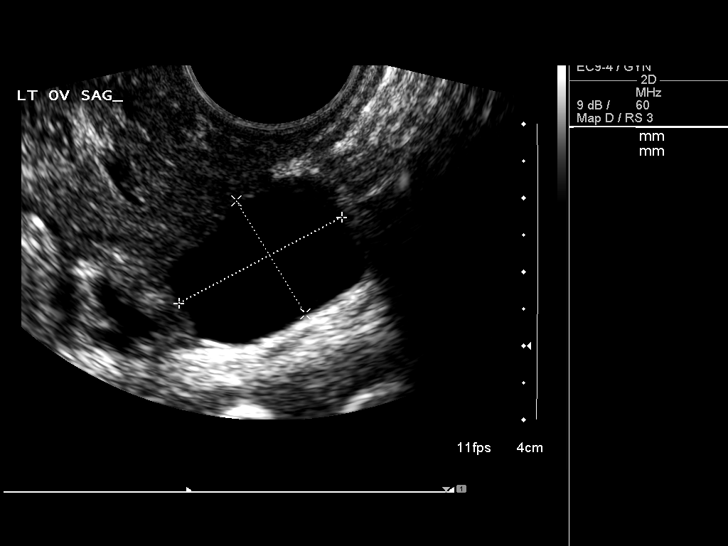
[im 41/61]
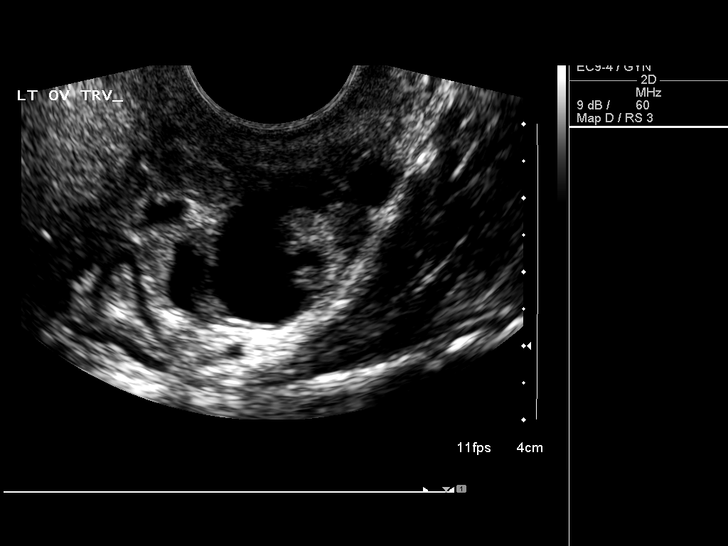
[im 46/61]
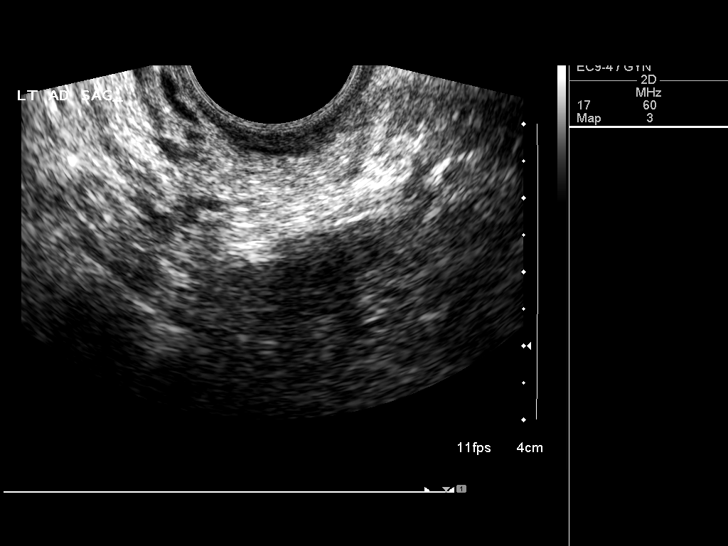
[im 51/61]
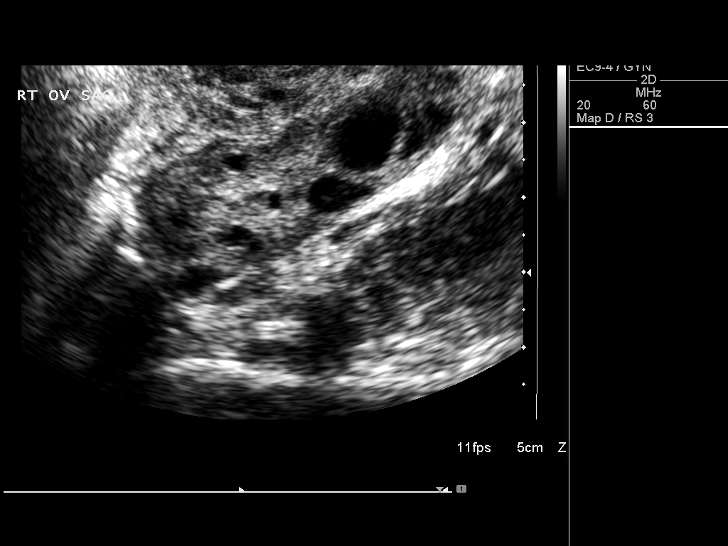
[im 56/61]
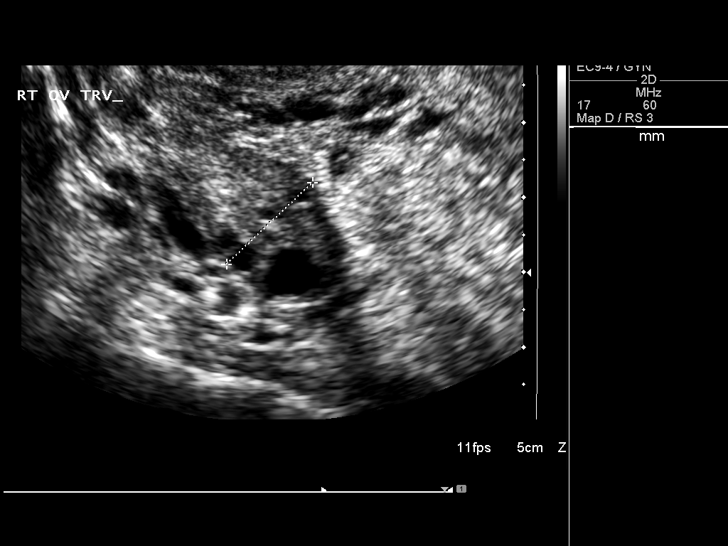
[im 61/61]
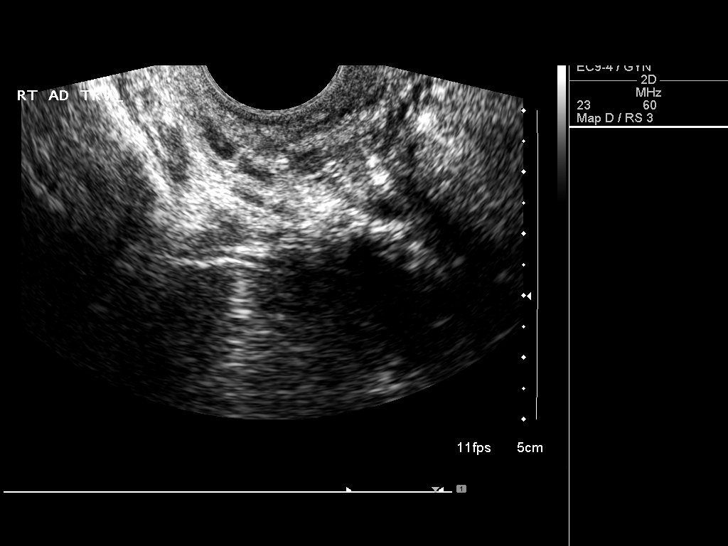

[13 of 25 positions shown; findings below may reference images not displayed]

FINDINGS: Uterus

Measurements: 8.1 x 4.0 x 4.3 cm. No fibroids or other mass
visualized.

Endometrium

Thickness: 3.1 mm.  No focal abnormality visualized.

Right ovary

Measurements: 4.5 x 1.9 x 1.6 cm. Normal appearance/no adnexal mass.

Left ovary

Measurements: 3.5 x 2.3 x 2.1 cm. There is a simple appearing left
ovarian cyst measuring 2.5 x 1.8 x 2.2 cm. Distal thick did

Other findings

No free fluid.
IMPRESSION: 1. Simple appearing left ovarian cyst measuring up to 2.5 cm in
diameter. There are developing follicles in the left ovary is well.
Normal appearance of the right ovary.
2. Normal appearance of the uterus and endometrium.
